# Patient Record
Sex: Female | Born: 1980 | Race: White | Hispanic: No | Marital: Married | Smoking: Current every day smoker
Health system: Southern US, Community
[De-identification: ages and names within clinical notes are randomized; demographics above are authoritative.]

## PROBLEM LIST (undated history)

## (undated) DIAGNOSIS — F419 Anxiety disorder, unspecified: Secondary | ICD-10-CM

## (undated) DIAGNOSIS — I10 Essential (primary) hypertension: Secondary | ICD-10-CM

## (undated) HISTORY — PX: ABDOMINAL HYSTERECTOMY: SHX81

---

## 1999-02-23 ENCOUNTER — Other Ambulatory Visit: Admission: RE | Admit: 1999-02-23 | Discharge: 1999-02-23 | Payer: Self-pay | Admitting: Gynecology

## 2000-04-24 ENCOUNTER — Other Ambulatory Visit: Admission: RE | Admit: 2000-04-24 | Discharge: 2000-04-24 | Payer: Self-pay | Admitting: Gynecology

## 2001-05-19 ENCOUNTER — Other Ambulatory Visit: Admission: RE | Admit: 2001-05-19 | Discharge: 2001-05-19 | Payer: Self-pay | Admitting: Gynecology

## 2002-06-04 ENCOUNTER — Other Ambulatory Visit: Admission: RE | Admit: 2002-06-04 | Discharge: 2002-06-04 | Payer: Self-pay | Admitting: Gynecology

## 2003-05-27 ENCOUNTER — Other Ambulatory Visit: Admission: RE | Admit: 2003-05-27 | Discharge: 2003-05-27 | Payer: Self-pay | Admitting: Gynecology

## 2007-10-13 ENCOUNTER — Ambulatory Visit: Payer: Self-pay

## 2008-05-09 ENCOUNTER — Ambulatory Visit: Payer: Self-pay | Admitting: Family Medicine

## 2008-11-21 IMAGING — CR DG CHEST 1V
1 series · 1 of 1 positions shown · non-contrast
Comparison: none

REASON FOR EXAM: +ppd 3 5 yrs ago had  +skin DR Eulogia Gandarilla.
health   fax report  [phone]
COMMENTS:

[view not recorded]
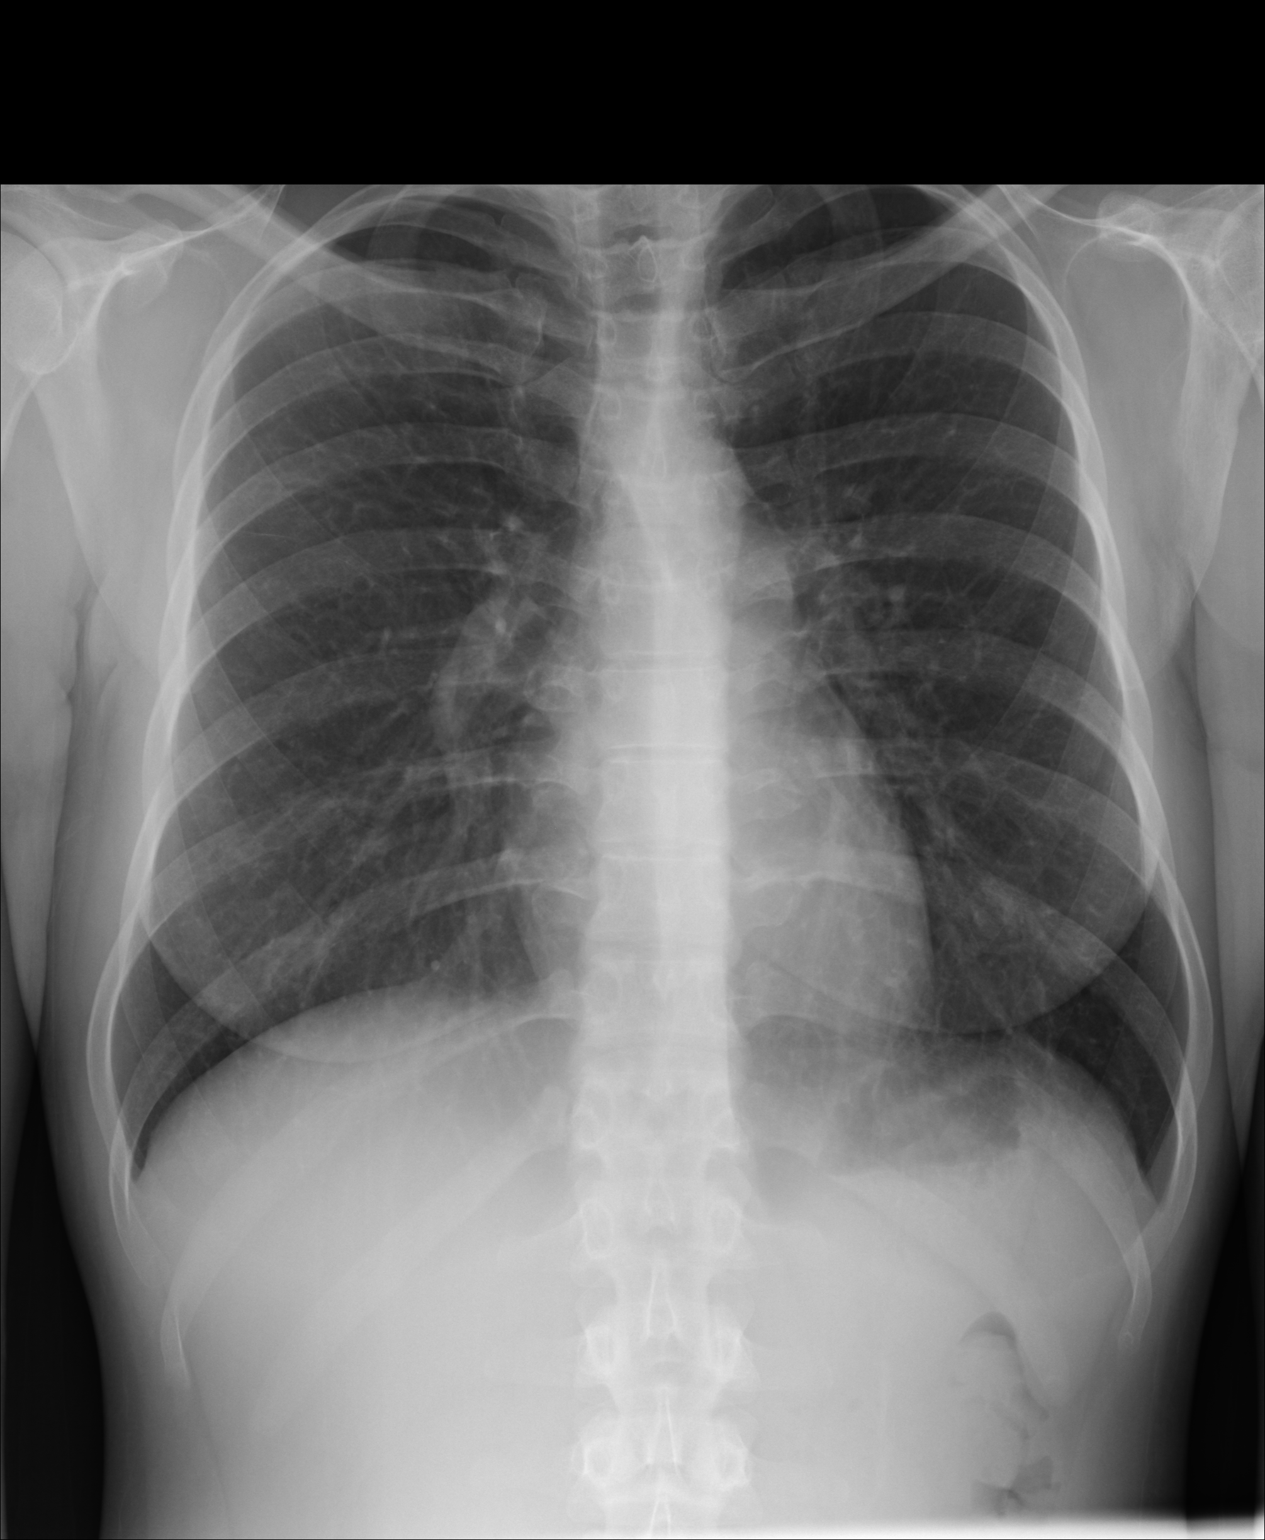

[1 of 1 positions shown; findings below may reference images not displayed]

PROCEDURE:     DXR - DXR CHEST 1 VIEWAP OR PA  - October 13, 2007 [DATE]

RESULT:     The lung fields are clear. Attention to the lung apices shows no
infiltrate or cystic changes suspicious for tuberculosis. The heart and
pulmonary vasculature are normal in appearance. No significant osseous
abnormalities are seen.
IMPRESSION: 1.     No significant abnormalities are noted.

## 2014-11-02 ENCOUNTER — Ambulatory Visit: Payer: Self-pay | Admitting: Obstetrics and Gynecology

## 2022-07-02 ENCOUNTER — Other Ambulatory Visit: Payer: Self-pay | Admitting: Obstetrics and Gynecology

## 2022-08-06 ENCOUNTER — Other Ambulatory Visit: Payer: Self-pay

## 2022-08-14 NOTE — H&P (Signed)
Jeanne Riley is a 41 y.o. female here for [facility]  bilateral salpingectomy  .Pt is here for follow up for menorrhagia for the > 1 yr . + clots and heavy bleeding 5-7 days  Pt underwent a SIS 10 months ago that show 2 endometrial polyps . Pt c/o band like lower abd / pelvic pain  for >3 yrs . + h/o constipation and she feels like she doesn't empty all the way. No dyspareunia .  Decreased sexual desire - Testosterone wnl  G3 P3  SVD x3      Pt here for follow up for Menorrhagia  EMBX neg  Pap last year neg  Ferritin 16     Past Medical History:  has a past medical history of Anxiety, COVID-19 (08/2021), COVID-19 (11/2019), Endometrial polyp (07/2021), Menorrhagia (07/2021), MVP (mitral valve prolapse), Obesity (BMI 30-39.9), unspecified, and Positive TB test.  Past Surgical History:  has a past surgical history that includes Arthroscopy Shoulder (Bilateral) and Wisdom Teeth. Family History: family history includes Alcohol abuse in her father; Breast cancer in her maternal aunt; Cancer in her maternal aunt; Diabetes type II in her maternal grandfather, maternal grandmother, and paternal aunt; High blood pressure (Hypertension) in her maternal grandfather, maternal grandmother, and mother; No Known Problems in her son, son, and son. Social History:  reports that she has been smoking cigarettes. She has been smoking an average of 1 pack per day. She has never used smokeless tobacco. She reports current alcohol use. She reports that she does not use drugs. OB/GYN History:  OB History       Gravida  3   Para  3   Term  3   Preterm  0   AB  0   Living  3        SAB  0   IAB  0   Ectopic  0   Molar  0   Multiple  0   Live Births  3             Allergies: is allergic to vicodin [hydrocodone-acetaminophen]. Medications:   Current Outpatient Medications:    ACETAMINOPHEN (TYLENOL ORAL), Take 1,000 mg by mouth every 6 (six) hours as needed, Disp: , Rfl:    CYANOCOBALAMIN,  VITAMIN B-12, ORAL, Take 1 tablet by mouth once daily, Disp: , Rfl:    enzymes,digestive (DIGESTIVE ENZYMES ORAL), Take 1 tablet by mouth once daily, Disp: , Rfl:    ibuprofen (ADVIL,MOTRIN) 200 MG tablet, Take 200 mg by mouth every 6 (six) hours as needed for Pain., Disp: , Rfl:    multivitamin tablet, Take 1 tablet by mouth once daily, Disp: , Rfl:    escitalopram oxalate (LEXAPRO) 5 MG tablet, Take 1 tablet (5 mg total) by mouth once daily May increase to 10 mg daily after 2-4 weeks if needed. (Patient not taking: Reported on 03/15/2022), Disp: 90 tablet, Rfl: 1   predniSONE (DELTASONE) 10 MG tablet, Taper:  6 tablets today, 5 tablets tomorrow, 4 tablets next day, 3 tablets next day, 2 tablets next day, 1 tablet on last day (Patient not taking: Reported on 05/13/2022), Disp: 21 tablet, Rfl: 0   Review of Systems: General:                      No fatigue or weight loss, +decreased sexual drive  Eyes:  No vision changes Ears:                            No hearing difficulty Respiratory:                No cough or shortness of breath Pulmonary:                  No asthma or shortness of breath Cardiovascular:           No chest pain, palpitations, dyspnea on exertion Gastrointestinal:          No abdominal bloating, chronic diarrhea, constipations, masses, pain or hematochezia Genitourinary:             No hematuria, dysuria, abnormal vaginal discharge, pelvic pain, +Menometrorrhagia Lymphatic:                   No swollen lymph nodes Musculoskeletal:         No muscle weakness Neurologic:                  No extremity weakness, syncope, seizure disorder Psychiatric:                  No history of depression, delusions or suicidal/homicidal ideation      Exam:       Vitals:    11/2/231112  BP: (!) 160/91  Pulse: (!) 130      Body mass index is 30.83 kg/m.   WDWN white/  female in NAD   Lungs: CTA  CV : RRR without murmur   Breast: exam done in sitting and  lying position : No dimpling or retraction, no dominant mass, no spontaneous discharge, no axillary adenopathy Neck:  no thyromegaly Abdomen: soft , no mass, normal active bowel sounds,  non-tender, no rebound tenderness Pelvic: tanner stage 5 ,  External genitalia: vulva /labia no lesions Urethra: no prolapse Vagina: normal physiologic d/c, adequate room for TVH if need be  Cervix: no lesions, no cervical motion tenderness   Uterus: normal size shape and contour, non-tender Adnexa: no mass,  non-tender   Rectovaginal:  .Saline infusion sonohysterography: betadine prep to the cervix followed by placement of the HSG catheter into the endometrial canal . Sterile H2O is injected while performing a transvaginal u/s . Findings: Saline  [place] Endometrial polyps seen 1= 1030 x 0.723 x 1.54 cm 2= 0.513 x 0.534 x 0.577 cm 3= 0.507 x 0.451 x 0.507 cm   Fibroid uterus 1 ant to endometrium= 3.55 cm 2 rt ant= 1.90 cm      Endometrium=  11.85 mm   bil ovs wnl    Impression:    The primary encounter diagnosis was Menorrhagia with regular cycle. Diagnoses of Endometrial polyp and Pelvic pain in female were also pertinent to this visit.       Plan:  Spoke to her about options to tx her menorrhagia . FXD+C , Myosure , endometrial ablation  vs TVH and BS . She elects for the latter  Benefits and risks to surgery: The proposed benefit of the surgery has been discussed with the patient. The possible risks include, but are not limited to: organ injury to the bowel , bladder, ureters, and major blood vessels and nerves. There is a possibility of additional surgeries resulting from these injuries. There is also the risk of blood transfusion and the need to receive blood products during or after the procedure which  may rarely lead to HIV or Hepatitis C infection. There is a risk of developing a deep venous thrombosis or a pulmonary embolism . There is the possibility of wound infection and also anesthetic  complications, even the rare possibility of death. The patient understands these risks and wishes to proceed.            No follow-ups on file.   Caroline Sauger, MD

## 2022-08-15 ENCOUNTER — Other Ambulatory Visit: Payer: Self-pay

## 2022-08-16 ENCOUNTER — Other Ambulatory Visit: Payer: Self-pay

## 2022-08-16 ENCOUNTER — Encounter
Admission: RE | Admit: 2022-08-16 | Discharge: 2022-08-16 | Disposition: A | Discharge status: Home / Self Care | Payer: Medicaid Other | Source: Ambulatory Visit | Attending: Obstetrics and Gynecology | Admitting: Obstetrics and Gynecology

## 2022-08-16 HISTORY — DX: Anxiety disorder, unspecified: F41.9

## 2022-08-16 HISTORY — DX: Essential (primary) hypertension: I10

## 2022-08-16 NOTE — Patient Instructions (Addendum)
Your procedure is scheduled on:08/26/2022  Report to the Registration Desk on the 1st floor of the [facility]. To find out your arrival time, please call [phone] between 1PM - 3PM on: 08/23/2022  If your arrival time is 6:00 am, do not arrive prior to that time as the [facility] entrance doors do not open until 6:00 am.  REMEMBER: Instructions that are not followed completely may result in serious medical risk, up to and including death; or upon the discretion of your surgeon and anesthesiologist your surgery may need to be rescheduled.  Do not eat food after midnight the night before surgery.  No gum chewing, lozengers or hard candies.  You may however, drink CLEAR liquids up to 2 hours before you are scheduled to arrive for your surgery. Do not drink anything within 2 hours of your scheduled arrival time.  Clear liquids include: - water  - apple juice without pulp - gatorade (not RED colors) - black coffee or tea (Do NOT add milk or creamers to the coffee or tea) Do NOT drink anything that is not on this list. In addition, your doctor has ordered for you to drink the provided  Ensure Pre-Surgery Clear Carbohydrate Drink  Drinking this carbohydrate drink up to two hours before surgery helps to reduce insulin resistance and improve patient outcomes. Please complete drinking 2 hours prior to scheduled arrival time.   One week prior to surgery: Stop Anti-inflammatories (NSAIDS) such as Advil, Aleve, Ibuprofen, Motrin, Naproxen, Naprosyn and Aspirin based products such as Excedrin, Goodys Powder, BC Powder. Stop ANY OVER THE COUNTER supplements until after surgery. You may however, continue to take Tylenol if needed for pain up until the day of surgery.  No Alcohol for 24 hours before or after surgery including wine.  No Smoking including e-cigarettes for 24 hours prior to surgery.  No chewable tobacco products for at least 6 hours prior to surgery.  No nicotine patches on  the day of surgery.  Do not use any "recreational" drugs for at least a week prior to your surgery.  Please be advised that the combination of cocaine and anesthesia may have negative outcomes, up to and including death. If you test positive for cocaine, your surgery will be cancelled.  On the morning of surgery brush your teeth with toothpaste and water, you may rinse your mouth with mouthwash if you wish. Do not swallow any toothpaste or mouthwash.  You may shower as directed on instruction sheet.  Do not wear jewelry, make-up, hairpins, clips or nail polish.  Do not wear lotions, powders, or perfumes.   Do not shave body from the neck down 48 hours prior to surgery just in case you cut yourself which could leave a site for infection.  Also, freshly shaved skin may become irritated if using the CHG soap.  Contact lenses, hearing aids and dentures may not be worn into surgery.   Do not bring valuables to the hospital. [facility] is not responsible for any missing/lost belongings or valuables.   Notify your doctor if there is any change in your medical condition (cold, fever, infection).  Wear comfortable clothing (specific to your surgery type) to the hospital.  After surgery, you can help prevent lung complications by doing breathing exercises.  Take deep breaths and cough every 1-2 hours. Your doctor may order a device called an Incentive Spirometer to help you take deep breaths. If you are being admitted to the hospital overnight, leave your suitcase in the  car. After surgery it may be brought to your room.  If you are being discharged the day of surgery, you will not be allowed to drive home. You will need a responsible adult (18 years or older) to drive you home and stay with you that night.   If you are taking public transportation, you will need to have a responsible adult (18 years or older) with you. Please confirm with your physician that it is acceptable to use public  transportation.   Please call the [place] [facility] at [phone] if you have any questions about these instructions.  Surgery Visitation Policy:  Patients undergoing a surgery or procedure may have two family members or support persons with them as long as the person is not COVID-19 positive or experiencing its symptoms.   Inpatient Visitation:    Visiting hours are 7 a.m. to 8 p.m. Up to four visitors are allowed at one time in a patient room, including children. The visitors may rotate out with other people during the day. One designated support person (adult) may remain overnight.

## 2022-08-19 ENCOUNTER — Encounter
Admission: RE | Admit: 2022-08-19 | Discharge: 2022-08-19 | Disposition: A | Discharge status: Home / Self Care | Payer: Medicaid Other | Source: Ambulatory Visit | Attending: Obstetrics and Gynecology | Admitting: Obstetrics and Gynecology

## 2022-08-19 ENCOUNTER — Encounter: Payer: Self-pay | Admitting: Urgent Care

## 2022-08-19 DIAGNOSIS — Z01812 Encounter for preprocedural laboratory examination: Secondary | ICD-10-CM | POA: Diagnosis present

## 2022-08-19 DIAGNOSIS — Z01818 Encounter for other preprocedural examination: Secondary | ICD-10-CM

## 2022-08-19 LAB — BASIC METABOLIC PANEL
Anion gap: 7 (ref 5–15)
BUN: 21 mg/dL — ABNORMAL HIGH (ref 6–20)
CO2: 25 mmol/L (ref 22–32)
Calcium: 9.2 mg/dL (ref 8.9–10.3)
Chloride: 109 mmol/L (ref 98–111)
Creatinine, Ser: 0.87 mg/dL (ref 0.44–1.00)
GFR, Estimated: >60 mL/min (ref >60)
Glucose, Bld: 104 mg/dL — ABNORMAL HIGH (ref 70–99)
Potassium: 4.1 mmol/L (ref 3.5–5.1)
Sodium: 141 mmol/L (ref 135–145)

## 2022-08-19 LAB — CBC
HCT: 43.2 % (ref 36.0–46.0)
Hemoglobin: 14.4 g/dL (ref 12.0–15.0)
MCH: 29.4 pg (ref 26.0–34.0)
MCHC: 33.3 g/dL (ref 30.0–36.0)
MCV: 88.2 fL (ref 80.0–100.0)
Platelets: 282 10*3/uL (ref 150–400)
RBC: 4.9 MIL/uL (ref 3.87–5.11)
RDW: 14.6 % (ref 11.5–15.5)
WBC: 9 10*3/uL (ref 4.0–10.5)
nRBC: 0 % (ref 0.0–0.2)

## 2022-08-19 LAB — TYPE AND SCREEN
ABO/RH(D): A NEG
Antibody Screen: NEGATIVE

## 2022-08-22 ENCOUNTER — Other Ambulatory Visit: Payer: Self-pay

## 2022-08-25 MED ORDER — LACTATED RINGERS IV SOLN: INTRAVENOUS | Status: DC

## 2022-08-25 MED ORDER — ACETAMINOPHEN 500 MG PO TABS: 1000.0000 mg | ORAL_TABLET | ORAL | Status: AC

## 2022-08-25 MED ORDER — ORAL CARE MOUTH RINSE: 15.0000 mL | Freq: Once | OROMUCOSAL | Status: AC

## 2022-08-25 MED ORDER — CEFAZOLIN SODIUM-DEXTROSE 2-4 GM/100ML-% IV SOLN
2.0000 g | Freq: Once | INTRAVENOUS | Status: AC
  Administered 2022-08-26: 2 g via INTRAVENOUS

## 2022-08-25 MED ORDER — FAMOTIDINE 20 MG PO TABS: 20.0000 mg | ORAL_TABLET | Freq: Once | ORAL | Status: AC

## 2022-08-25 MED ORDER — GABAPENTIN 300 MG PO CAPS: 300.0000 mg | ORAL_CAPSULE | ORAL | Status: AC

## 2022-08-25 MED ORDER — POVIDONE-IODINE 10 % EX SWAB: 2.0000 | Freq: Once | CUTANEOUS | Status: DC

## 2022-08-25 MED ORDER — CHLORHEXIDINE GLUCONATE 0.12 % MT SOLN: 15.0000 mL | Freq: Once | OROMUCOSAL | Status: AC

## 2022-08-26 ENCOUNTER — Ambulatory Visit: Payer: Medicaid Other | Admitting: Anesthesiology

## 2022-08-26 ENCOUNTER — Encounter: Payer: Self-pay | Admitting: Obstetrics and Gynecology

## 2022-08-26 ENCOUNTER — Other Ambulatory Visit: Payer: Self-pay

## 2022-08-26 ENCOUNTER — Encounter
Admission: RE | Disposition: A | Discharge status: Home / Self Care | Payer: Self-pay | Source: Home / Self Care | Attending: Obstetrics and Gynecology

## 2022-08-26 ENCOUNTER — Ambulatory Visit
Admission: RE | Admit: 2022-08-26 | Discharge: 2022-08-26 | Disposition: A | Discharge status: Home / Self Care | Payer: Medicaid Other | Attending: Obstetrics and Gynecology | Admitting: Obstetrics and Gynecology

## 2022-08-26 DIAGNOSIS — N920 Excessive and frequent menstruation with regular cycle: Secondary | ICD-10-CM | POA: Insufficient documentation

## 2022-08-26 DIAGNOSIS — F419 Anxiety disorder, unspecified: Secondary | ICD-10-CM | POA: Insufficient documentation

## 2022-08-26 DIAGNOSIS — I341 Nonrheumatic mitral (valve) prolapse: Secondary | ICD-10-CM | POA: Insufficient documentation

## 2022-08-26 DIAGNOSIS — N840 Polyp of corpus uteri: Secondary | ICD-10-CM | POA: Insufficient documentation

## 2022-08-26 DIAGNOSIS — Z01818 Encounter for other preprocedural examination: Secondary | ICD-10-CM

## 2022-08-26 DIAGNOSIS — D259 Leiomyoma of uterus, unspecified: Secondary | ICD-10-CM | POA: Insufficient documentation

## 2022-08-26 DIAGNOSIS — F17210 Nicotine dependence, cigarettes, uncomplicated: Secondary | ICD-10-CM | POA: Insufficient documentation

## 2022-08-26 DIAGNOSIS — I10 Essential (primary) hypertension: Secondary | ICD-10-CM | POA: Insufficient documentation

## 2022-08-26 HISTORY — PX: VAGINAL HYSTERECTOMY: SHX2639

## 2022-08-26 LAB — POCT PREGNANCY, URINE: Preg Test, Ur: NEGATIVE

## 2022-08-26 LAB — ABO/RH: ABO/RH(D): A NEG

## 2022-08-26 SURGERY — HYSTERECTOMY, VAGINAL
Anesthesia: General | Site: Vagina | Laterality: Bilateral

## 2022-08-26 MED ORDER — 0.9 % SODIUM CHLORIDE (POUR BTL) OPTIME
TOPICAL | Status: DC | PRN
  Administered 2022-08-26: 500 mL

## 2022-08-26 MED ORDER — ACETAMINOPHEN 500 MG PO TABS
ORAL_TABLET | ORAL | Status: AC
  Administered 2022-08-26: 1000 mg via ORAL
  Filled 2022-08-26: qty 2

## 2022-08-26 MED ORDER — PROPOFOL 10 MG/ML IV BOLUS
INTRAVENOUS | Status: AC
  Filled 2022-08-26: qty 20

## 2022-08-26 MED ORDER — GABAPENTIN 300 MG PO CAPS
ORAL_CAPSULE | ORAL | Status: AC
  Administered 2022-08-26: 300 mg via ORAL
  Filled 2022-08-26: qty 1

## 2022-08-26 MED ORDER — LIDOCAINE-EPINEPHRINE 1 %-1:100000 IJ SOLN
INTRAMUSCULAR | Status: AC
  Filled 2022-08-26: qty 1

## 2022-08-26 MED ORDER — MIDAZOLAM HCL 2 MG/2ML IJ SOLN
INTRAMUSCULAR | Status: DC | PRN
  Administered 2022-08-26: 2 mg via INTRAVENOUS

## 2022-08-26 MED ORDER — HYDROMORPHONE HCL 1 MG/ML IJ SOLN
INTRAMUSCULAR | Status: AC
  Administered 2022-08-26: 0.25 mg via INTRAVENOUS
  Filled 2022-08-26: qty 1

## 2022-08-26 MED ORDER — CHLORHEXIDINE GLUCONATE 0.12 % MT SOLN
OROMUCOSAL | Status: AC
  Administered 2022-08-26: 15 mL via OROMUCOSAL
  Filled 2022-08-26: qty 15

## 2022-08-26 MED ORDER — DEXAMETHASONE SODIUM PHOSPHATE 10 MG/ML IJ SOLN
INTRAMUSCULAR | Status: DC | PRN
  Administered 2022-08-26: 10 mg via INTRAVENOUS

## 2022-08-26 MED ORDER — FENTANYL CITRATE (PF) 100 MCG/2ML IJ SOLN
INTRAMUSCULAR | Status: DC | PRN
  Administered 2022-08-26: 25 ug via INTRAVENOUS
  Administered 2022-08-26 (×2): 50 ug via INTRAVENOUS
  Administered 2022-08-26 (×3): 25 ug via INTRAVENOUS

## 2022-08-26 MED ORDER — HYDROMORPHONE HCL 1 MG/ML IJ SOLN
0.2500 mg | INTRAMUSCULAR | Status: DC | PRN
  Administered 2022-08-26 (×2): 0.25 mg via INTRAVENOUS
  Administered 2022-08-26: 0.5 mg via INTRAVENOUS
  Administered 2022-08-26: 0.25 mg via INTRAVENOUS

## 2022-08-26 MED ORDER — ONDANSETRON HCL 4 MG/2ML IJ SOLN
INTRAMUSCULAR | Status: DC | PRN
  Administered 2022-08-26: 4 mg via INTRAVENOUS

## 2022-08-26 MED ORDER — LIDOCAINE HCL (CARDIAC) PF 100 MG/5ML IV SOSY
PREFILLED_SYRINGE | INTRAVENOUS | Status: DC | PRN
  Administered 2022-08-26: 100 mg via INTRAVENOUS

## 2022-08-26 MED ORDER — OXYCODONE HCL 5 MG PO TABS: 5.0000 mg | ORAL_TABLET | Freq: Once | ORAL | Status: AC | PRN

## 2022-08-26 MED ORDER — FENTANYL CITRATE (PF) 100 MCG/2ML IJ SOLN
INTRAMUSCULAR | Status: AC
  Filled 2022-08-26: qty 2

## 2022-08-26 MED ORDER — ONDANSETRON 4 MG PO TBDP: 4.0000 mg | ORAL_TABLET | Freq: Four times a day (QID) | ORAL | Status: DC | PRN

## 2022-08-26 MED ORDER — ONDANSETRON HCL 4 MG/2ML IJ SOLN
INTRAMUSCULAR | Status: AC
  Filled 2022-08-26: qty 2

## 2022-08-26 MED ORDER — OXYCODONE HCL 5 MG/5ML PO SOLN: 5.0000 mg | Freq: Once | ORAL | Status: AC | PRN

## 2022-08-26 MED ORDER — GLYCOPYRROLATE 0.2 MG/ML IJ SOLN
INTRAMUSCULAR | Status: DC | PRN
  Administered 2022-08-26: .2 mg via INTRAVENOUS

## 2022-08-26 MED ORDER — HYDROMORPHONE HCL 1 MG/ML IJ SOLN
INTRAMUSCULAR | Status: AC
  Administered 2022-08-26: 0.5 mg via INTRAVENOUS
  Filled 2022-08-26: qty 1

## 2022-08-26 MED ORDER — DEXAMETHASONE SODIUM PHOSPHATE 10 MG/ML IJ SOLN
INTRAMUSCULAR | Status: AC
  Filled 2022-08-26: qty 1

## 2022-08-26 MED ORDER — OXYCODONE HCL 5 MG PO TABS: 5.0000 mg | ORAL_TABLET | ORAL | Status: DC | PRN

## 2022-08-26 MED ORDER — GLYCOPYRROLATE 0.2 MG/ML IJ SOLN
INTRAMUSCULAR | Status: AC
  Filled 2022-08-26: qty 2

## 2022-08-26 MED ORDER — FAMOTIDINE 20 MG PO TABS
ORAL_TABLET | ORAL | Status: AC
  Administered 2022-08-26: 20 mg via ORAL
  Filled 2022-08-26: qty 1

## 2022-08-26 MED ORDER — KETOROLAC TROMETHAMINE 30 MG/ML IJ SOLN
INTRAMUSCULAR | Status: AC
  Filled 2022-08-26: qty 1

## 2022-08-26 MED ORDER — MIDAZOLAM HCL 2 MG/2ML IJ SOLN
INTRAMUSCULAR | Status: AC
  Filled 2022-08-26: qty 2

## 2022-08-26 MED ORDER — OXYCODONE HCL 5 MG PO TABS
ORAL_TABLET | ORAL | Status: AC
  Administered 2022-08-26: 5 mg via ORAL
  Filled 2022-08-26: qty 1

## 2022-08-26 MED ORDER — DEXMEDETOMIDINE HCL IN NACL 200 MCG/50ML IV SOLN
INTRAVENOUS | Status: DC | PRN
  Administered 2022-08-26: 4 ug via INTRAVENOUS
  Administered 2022-08-26 (×2): 8 ug via INTRAVENOUS

## 2022-08-26 MED ORDER — STERILE WATER FOR IRRIGATION IR SOLN
Status: DC | PRN
  Administered 2022-08-26: 500 mL

## 2022-08-26 MED ORDER — LIDOCAINE-EPINEPHRINE 1 %-1:100000 IJ SOLN
INTRAMUSCULAR | Status: DC | PRN
  Administered 2022-08-26: 16 mL

## 2022-08-26 MED ORDER — CEFAZOLIN SODIUM-DEXTROSE 2-4 GM/100ML-% IV SOLN
INTRAVENOUS | Status: AC
  Filled 2022-08-26: qty 100

## 2022-08-26 MED ORDER — PROPOFOL 10 MG/ML IV BOLUS
INTRAVENOUS | Status: DC | PRN
  Administered 2022-08-26: 50 mg via INTRAVENOUS
  Administered 2022-08-26: 150 mg via INTRAVENOUS

## 2022-08-26 MED ORDER — KETOROLAC TROMETHAMINE 30 MG/ML IJ SOLN
INTRAMUSCULAR | Status: DC | PRN
  Administered 2022-08-26: 30 mg via INTRAVENOUS

## 2022-08-26 MED ORDER — LIDOCAINE HCL (PF) 2 % IJ SOLN
INTRAMUSCULAR | Status: AC
  Filled 2022-08-26: qty 5

## 2022-08-26 SURGICAL SUPPLY — 43 items
BAG DRN RND TRDRP ANRFLXCHMBR (UROLOGICAL SUPPLIES)
BAG URINE DRAIN 2000ML AR STRL (UROLOGICAL SUPPLIES) IMPLANT
CATH FOLEY 2WAY  5CC 16FR (CATHETERS) ×1
CATH FOLEY 2WAY 5CC 16FR (CATHETERS) ×1
CATH ROBINSON RED A/P 16FR (CATHETERS) ×1 IMPLANT
CATH URTH 16FR FL 2W BLN LF (CATHETERS) IMPLANT
DRAPE PERI LITHO V/GYN (MISCELLANEOUS) ×1 IMPLANT
DRAPE SURG 17X11 SM STRL (DRAPES) ×1 IMPLANT
DRAPE UNDER BUTTOCK W/FLU (DRAPES) ×1 IMPLANT
ELECT REM PT RETURN 9FT ADLT (ELECTROSURGICAL) ×1
ELECTRODE REM PT RTRN 9FT ADLT (ELECTROSURGICAL) ×1 IMPLANT
GAUZE 4X4 16PLY ~~LOC~~+RFID DBL (SPONGE) ×2 IMPLANT
GLOVE SURG PROTEXIS BL SZ6.5 (GLOVE) ×2 IMPLANT
GLOVE SURG SYN 6.5 PF PI BL (GLOVE) IMPLANT
GLOVE SURG SYN 8.0 (GLOVE) ×1 IMPLANT
GLOVE SURG SYN 8.0 PF PI (GLOVE) ×1 IMPLANT
GOWN STRL REUS W/ TWL LRG LVL3 (GOWN DISPOSABLE) ×3 IMPLANT
GOWN STRL REUS W/ TWL XL LVL3 (GOWN DISPOSABLE) ×1 IMPLANT
GOWN STRL REUS W/TWL LRG LVL3 (GOWN DISPOSABLE) ×3
GOWN STRL REUS W/TWL XL LVL3 (GOWN DISPOSABLE) ×1
IV SODIUM CHL 0.9% 500ML (IV SOLUTION) IMPLANT
KIT TURNOVER CYSTO (KITS) ×1 IMPLANT
LABEL OR SOLS (LABEL) ×1 IMPLANT
MANIFOLD NEPTUNE II (INSTRUMENTS) ×1 IMPLANT
NEEDLE HYPO 22GX1.5 SAFETY (NEEDLE) ×1 IMPLANT
PACK BASIN MINOR ARMC (MISCELLANEOUS) ×1 IMPLANT
PAD OB MATERNITY 4.3X12.25 (PERSONAL CARE ITEMS) ×1 IMPLANT
PAD PREP 24X41 OB/GYN DISP (PERSONAL CARE ITEMS) ×1 IMPLANT
SCRUB CHG 4% DYNA-HEX 4OZ (MISCELLANEOUS) ×1 IMPLANT
SOL SCRUB PVP POV-IOD 4OZ 7.5% (MISCELLANEOUS) ×1
SOLUTION SCRB POV-IOD 4OZ 7.5% (MISCELLANEOUS) ×1 IMPLANT
SURGILUBE 2OZ TUBE FLIPTOP (MISCELLANEOUS) ×1 IMPLANT
SUT PDS 2-0 27IN (SUTURE) IMPLANT
SUT VIC AB 0 CT1 27 (SUTURE) ×4
SUT VIC AB 0 CT1 27XCR 8 STRN (SUTURE) ×2 IMPLANT
SUT VIC AB 0 CT1 36 (SUTURE) ×1 IMPLANT
SUT VIC AB 2-0 SH 27 (SUTURE) ×1
SUT VIC AB 2-0 SH 27XBRD (SUTURE) ×1 IMPLANT
SYR 10ML LL (SYRINGE) ×1 IMPLANT
SYR CONTROL 10ML LL (SYRINGE) ×1 IMPLANT
TRAP FLUID SMOKE EVACUATOR (MISCELLANEOUS) ×1 IMPLANT
WATER STERILE IRR 1000ML POUR (IV SOLUTION) ×1 IMPLANT
WATER STERILE IRR 500ML POUR (IV SOLUTION) ×1 IMPLANT

## 2022-08-26 NOTE — Anesthesia Preprocedure Evaluation (Signed)
Anesthesia Evaluation  Patient identified by MRN, date of birth, ID band Patient awake    Reviewed: Allergy & Precautions, NPO status , Patient's Chart, lab work & pertinent test results  History of Anesthesia Complications Negative for: history of anesthetic complications  Airway Mallampati: II  TM Distance: >3 FB Neck ROM: full    Dental  (+) Chipped, Teeth Intact   Pulmonary neg pulmonary ROS, Current Smoker and Patient abstained from smoking.   Pulmonary exam normal        Cardiovascular Exercise Tolerance: Good hypertension, On Medications Normal cardiovascular exam     Neuro/Psych  PSYCHIATRIC DISORDERS Anxiety     negative neurological ROS     GI/Hepatic negative GI ROS, Neg liver ROS,,,  Endo/Other  negative endocrine ROS    Renal/GU      Musculoskeletal   Abdominal   Peds  Hematology negative hematology ROS (+)   Anesthesia Other Findings Past Medical History: No date: Anxiety No date: Hypertension  History reviewed. No pertinent surgical history.  BMI    Body Mass Index: 31.15 kg/m      Reproductive/Obstetrics negative OB ROS                             Anesthesia Physical Anesthesia Plan  ASA: 2  Anesthesia Plan: General LMA   Post-op Pain Management: Dilaudid IV, Toradol IV (intra-op)*, Tylenol PO (pre-op)* and Gabapentin PO (pre-op)*   Induction: Intravenous  PONV Risk Score and Plan: 3 and Dexamethasone, Ondansetron, Midazolam and Treatment may vary due to age or medical condition  Airway Management Planned: LMA  Additional Equipment:   Intra-op Plan:   Post-operative Plan: Extubation in OR  Informed Consent: I have reviewed the patients History and Physical, chart, labs and discussed the procedure including the risks, benefits and alternatives for the proposed anesthesia with the patient or authorized representative who has indicated his/her  understanding and acceptance.     Dental Advisory Given  Plan Discussed with: Anesthesiologist, CRNA and Surgeon  Anesthesia Plan Comments: (Patient consented for risks of anesthesia including but not limited to:  - adverse reactions to medications - damage to eyes, teeth, lips or other oral mucosa - nerve damage due to positioning  - sore throat or hoarseness - Damage to heart, brain, nerves, lungs, other parts of body or loss of life  Patient voiced understanding.)       Anesthesia Quick Evaluation

## 2022-08-26 NOTE — Transfer of Care (Signed)
Immediate Anesthesia Transfer of Care Note  Patient: Jeanne Riley  Procedure(s) Performed: HYSTERECTOMY VAGINAL, BILATERAL SALPINGECTOMY (Bilateral: Vagina )  Patient Location: PACU  Anesthesia Type:General  Level of Consciousness: drowsy and patient cooperative  Airway & Oxygen Therapy: Patient Spontanous Breathing and Patient connected to face mask oxygen  Post-op Assessment: Report given to RN and Post -op Vital signs reviewed and stable  Post vital signs: Reviewed and stable  Last Vitals:  Vitals Value Taken Time  BP 126/86 08/26/22 0915  Temp 36.6 C 08/26/22 0915  Pulse 95 08/26/22 0925  Resp 12 08/26/22 0925  SpO2 93 % 08/26/22 0925  Vitals shown include unvalidated device data.  Last Pain:  Vitals:   08/26/22 0915  TempSrc:   PainSc: 8          Complications: No notable events documented.

## 2022-08-26 NOTE — Discharge Instructions (Signed)

## 2022-08-26 NOTE — Op Note (Signed)
NAME: Jeanne Riley, Jeanne Riley MEDICAL RECORD NO: [id number] ACCOUNT NO: [id number] DATE OF BIRTH: Jul 13, 1981 FACILITY: [facility] LOCATION: [facility] PHYSICIAN: Boykin Nearing, MD  Operative Report   DATE OF PROCEDURE: 08/26/2022  PREOPERATIVE DIAGNOSES:  1.  Menorrhagia. 2.  Endometrial polyps.  POSTOPERATIVE DIAGNOSES:  1.  Menorrhagia. 2.  Endometrial polyps.  PROCEDURE:  1.  Total vaginal hysterectomy. 2.  Bilateral salpingectomy.  ANESTHESIA:  General endotracheal anesthesia.  SURGEON:  Boykin Nearing, MD  FIRST ASSISTANT:  Donneta Romberg, MD  SECOND ASSISTANT:  PA student, Corwin Sink.  INDICATIONS:  A 41 year old female with long history of menorrhagia with a saline infusion sonohysterography that demonstrated 2 separate polyps.  The patient has elected for permanent definitive surgery.  DESCRIPTION OF PROCEDURE:  After adequate general endotracheal anesthesia, the patient was placed in dorsal supine position with legs placed in the candy cane stirrups.  The patient's abdomen, perineum and vagina were prepped and draped in normal sterile  fashion.  The patient did receive 2 grams of IV Ancef for surgical prophylaxis.  Timeout was performed.  Straight catheterization of the bladder yielded 30 mL clear urine.  Weighted speculum was placed in the posterior vaginal vault and the cervix was  grasped with 2 thyroid tenacula.  Cervix was circumferentially injected with 1% lidocaine with 1:100,000 epinephrine.  A direct posterior colpotomy incision was made. Upon entry into the posterior cul-de-sac, the peritoneum was tagged to the cervical epithelium.  The uterosacral ligaments were then bilaterally clamped, transected and suture ligated with 0 Vicryl suture.  Cardinal ligaments were then clamped and transection and suture ligated .  These were tagged for later  identification.  The anterior cervix was circumferentially incised with the Bovie followed by bilateral clamping of  the cardinal ligaments, transection and suture ligating.  The anterior cul-de-sac was entered sharply without difficulty and Deaver  retractor was placed within to elevate the bladder anteriorly.  Uterine arteries were then bilaterally clamped, transected and suture ligated 0 Vicryl suture.  Sequential clamping of the broad ligament ensued.  Uterus was then decompressed with the Bovie  given the girth of the uterus.  The cornua were then bilaterally clamped, transected and suture ligated doubly with 0 Vicryl suture.  Uterus and cervix were delivered.  Good hemostasis was noted.  Each fallopian tube was grasped with the distal portion  and each distal portion of fallopian tube was clamped, transected and suture ligated with 0 Vicryl suture.  Good hemostasis was noted.  The peritoneum was then closed with a 2-0 PDS suture in a pursestring fashion and the vaginal cuff was then closed  vertically with a running 0 Vicryl suture.  Good hemostasis noted.  Uterosacral ligaments were plicated centrally and the rest of the vaginal cuff was closed with the 0 Vicryl suture.  Straight catheterization of the bladder yielded 20 mL additional  urine at the end of the case.  There were no complications.  ESTIMATED BLOOD LOSS:  30 mL  INTRAOPERATIVE FLUIDS:  800 mL  URINE OUTPUT:  50 mL  The patient did receive 30 mg intravenous Toradol at the end of the procedure.      PAA D: 08/26/2022 9:15:55 am T: 08/26/2022 10:33:00 am  JOB: [id number]/ [id number]

## 2022-08-26 NOTE — Progress Notes (Signed)
Pt here for TVH and BS . NPO except ensure drink . Labs reviewed . Neg HCG . Proceed

## 2022-08-26 NOTE — Anesthesia Postprocedure Evaluation (Signed)
Anesthesia Post Note  Patient: Jeanne Riley  Procedure(s) Performed: HYSTERECTOMY VAGINAL, BILATERAL SALPINGECTOMY (Bilateral: Vagina )  Patient location during evaluation: PACU Anesthesia Type: General Level of consciousness: awake and alert Pain management: pain level controlled Vital Signs Assessment: post-procedure vital signs reviewed and stable Respiratory status: spontaneous breathing, nonlabored ventilation, respiratory function stable and patient connected to nasal cannula oxygen Cardiovascular status: blood pressure returned to baseline and stable Postop Assessment: no apparent nausea or vomiting Anesthetic complications: no   No notable events documented.   Last Vitals:  Vitals:   08/26/22 0945 08/26/22 0950  BP: (!) 127/97   Pulse: 94 94  Resp: 10 12  Temp:    SpO2: 95% 91%    Last Pain:  Vitals:   08/26/22 0950  TempSrc:   PainSc: 4                  Ilene Qua

## 2022-08-26 NOTE — Brief Op Note (Signed)
08/26/2022  9:04 AM  PATIENT:  Jeanne Riley  41 y.o. female  PRE-OPERATIVE DIAGNOSIS:  menorrhagia, endometrial polyps  POST-OPERATIVE DIAGNOSIS:  menorrhagia, endometrial polyps  PROCEDURE:  Procedure(s): HYSTERECTOMY VAGINAL, BILATERAL SALPINGECTOMY (Bilateral)  SURGEON:  Surgeon(s) and Role:    * Shatona Andujar, Gwen Her, MD - Primary    * Will Bonnet, MD - Assisting  PHYSICIAN ASSISTANT: PA student Clara [place] Sink  ASSISTANTS: none   ANESTHESIA:   general  EBL:  5 mL   BLOOD ADMINISTERED:none  DRAINS: none   LOCAL MEDICATIONS USED:  LIDOCAINE   SPECIMEN:  Source of Specimen:  cervix , uterus and bilateral tubes  DISPOSITION OF SPECIMEN:  PATHOLOGY  COUNTS:  YES  TOURNIQUET:  * No tourniquets in log *  DICTATION: .Other Dictation: Dictation Number verbal  PLAN OF CARE: Discharge to home after PACU  PATIENT DISPOSITION:  PACU - hemodynamically stable.   Delay start of Pharmacological VTE agent (>24hrs) due to surgical blood loss or risk of bleeding: not applicable

## 2022-08-26 NOTE — Anesthesia Procedure Notes (Signed)
Procedure Name: LMA Insertion Date/Time: 08/26/2022 7:41 AM  Performed by: Jonna Clark, CRNAPre-anesthesia Checklist: Patient identified, Patient being monitored, Timeout performed, Emergency Drugs available and Suction available Patient Re-evaluated:Patient Re-evaluated prior to induction Oxygen Delivery Method: Circle system utilized Preoxygenation: Pre-oxygenation with 100% oxygen Induction Type: IV induction Ventilation: Mask ventilation without difficulty LMA: LMA inserted LMA Size: 4.0 Tube type: Oral Number of attempts: 1 Placement Confirmation: positive ETCO2 and breath sounds checked- equal and bilateral Tube secured with: Tape Dental Injury: Teeth and Oropharynx as per pre-operative assessment

## 2022-08-27 ENCOUNTER — Encounter: Payer: Self-pay | Admitting: Obstetrics and Gynecology

## 2022-08-27 LAB — SURGICAL PATHOLOGY

## 2022-11-12 ENCOUNTER — Other Ambulatory Visit: Payer: Self-pay | Admitting: Physician Assistant

## 2022-11-12 DIAGNOSIS — R200 Anesthesia of skin: Secondary | ICD-10-CM

## 2022-11-12 DIAGNOSIS — H538 Other visual disturbances: Secondary | ICD-10-CM

## 2022-11-12 DIAGNOSIS — R4189 Other symptoms and signs involving cognitive functions and awareness: Secondary | ICD-10-CM

## 2022-11-12 DIAGNOSIS — R29898 Other symptoms and signs involving the musculoskeletal system: Secondary | ICD-10-CM

## 2022-11-12 DIAGNOSIS — H539 Unspecified visual disturbance: Secondary | ICD-10-CM

## 2022-11-12 DIAGNOSIS — R251 Tremor, unspecified: Secondary | ICD-10-CM

## 2022-12-05 ENCOUNTER — Ambulatory Visit
Admission: RE | Admit: 2022-12-05 | Discharge: 2022-12-05 | Disposition: A | Discharge status: Home / Self Care | Payer: Medicaid Other | Source: Ambulatory Visit | Attending: Physician Assistant | Admitting: Physician Assistant

## 2022-12-05 DIAGNOSIS — R29898 Other symptoms and signs involving the musculoskeletal system: Secondary | ICD-10-CM | POA: Insufficient documentation

## 2022-12-05 DIAGNOSIS — H539 Unspecified visual disturbance: Secondary | ICD-10-CM | POA: Insufficient documentation

## 2022-12-05 DIAGNOSIS — R4189 Other symptoms and signs involving cognitive functions and awareness: Secondary | ICD-10-CM | POA: Insufficient documentation

## 2022-12-05 DIAGNOSIS — H538 Other visual disturbances: Secondary | ICD-10-CM | POA: Insufficient documentation

## 2022-12-05 DIAGNOSIS — R200 Anesthesia of skin: Secondary | ICD-10-CM | POA: Insufficient documentation

## 2022-12-05 DIAGNOSIS — R251 Tremor, unspecified: Secondary | ICD-10-CM | POA: Diagnosis present

## 2022-12-05 DIAGNOSIS — R202 Paresthesia of skin: Secondary | ICD-10-CM | POA: Insufficient documentation

## 2022-12-26 ENCOUNTER — Encounter: Payer: Self-pay | Admitting: *Deleted

## 2022-12-27 ENCOUNTER — Encounter
Admission: RE | Disposition: A | Discharge status: Home / Self Care | Payer: Self-pay | Source: Home / Self Care | Attending: Gastroenterology

## 2022-12-27 ENCOUNTER — Ambulatory Visit: Payer: Medicaid Other | Admitting: Anesthesiology

## 2022-12-27 ENCOUNTER — Ambulatory Visit
Admission: RE | Admit: 2022-12-27 | Discharge: 2022-12-27 | Disposition: A | Discharge status: Home / Self Care | Payer: Medicaid Other | Attending: Gastroenterology | Admitting: Gastroenterology

## 2022-12-27 ENCOUNTER — Encounter: Payer: Self-pay | Admitting: *Deleted

## 2022-12-27 DIAGNOSIS — I10 Essential (primary) hypertension: Secondary | ICD-10-CM | POA: Diagnosis not present

## 2022-12-27 DIAGNOSIS — Z6831 Body mass index (BMI) 31.0-31.9, adult: Secondary | ICD-10-CM | POA: Insufficient documentation

## 2022-12-27 DIAGNOSIS — Z09 Encounter for follow-up examination after completed treatment for conditions other than malignant neoplasm: Secondary | ICD-10-CM | POA: Insufficient documentation

## 2022-12-27 DIAGNOSIS — Z8616 Personal history of COVID-19: Secondary | ICD-10-CM | POA: Insufficient documentation

## 2022-12-27 DIAGNOSIS — R194 Change in bowel habit: Secondary | ICD-10-CM | POA: Insufficient documentation

## 2022-12-27 DIAGNOSIS — K640 First degree hemorrhoids: Secondary | ICD-10-CM | POA: Diagnosis not present

## 2022-12-27 DIAGNOSIS — Z90710 Acquired absence of both cervix and uterus: Secondary | ICD-10-CM | POA: Insufficient documentation

## 2022-12-27 DIAGNOSIS — E669 Obesity, unspecified: Secondary | ICD-10-CM | POA: Insufficient documentation

## 2022-12-27 HISTORY — PX: COLONOSCOPY WITH PROPOFOL: SHX5780

## 2022-12-27 SURGERY — COLONOSCOPY WITH PROPOFOL
Anesthesia: General

## 2022-12-27 MED ORDER — SODIUM CHLORIDE 0.9 % IV SOLN: INTRAVENOUS | Status: DC

## 2022-12-27 MED ORDER — DEXMEDETOMIDINE HCL IN NACL 200 MCG/50ML IV SOLN
INTRAVENOUS | Status: DC | PRN
  Administered 2022-12-27 (×2): 10 ug via INTRAVENOUS

## 2022-12-27 MED ORDER — LIDOCAINE HCL (CARDIAC) PF 100 MG/5ML IV SOSY
PREFILLED_SYRINGE | INTRAVENOUS | Status: DC | PRN
  Administered 2022-12-27: 50 mg via INTRAVENOUS

## 2022-12-27 MED ORDER — PROPOFOL 500 MG/50ML IV EMUL
INTRAVENOUS | Status: DC | PRN
  Administered 2022-12-27: 110 ug/kg/min via INTRAVENOUS

## 2022-12-27 MED ORDER — PROPOFOL 10 MG/ML IV BOLUS
INTRAVENOUS | Status: DC | PRN
  Administered 2022-12-27: 80 mg via INTRAVENOUS
  Administered 2022-12-27: 20 mg via INTRAVENOUS
  Administered 2022-12-27: 100 mg via INTRAVENOUS
  Administered 2022-12-27: 20 mg via INTRAVENOUS

## 2022-12-27 NOTE — Anesthesia Preprocedure Evaluation (Signed)
Anesthesia Evaluation  Patient identified by MRN, date of birth, ID band Patient awake    Reviewed: Allergy & Precautions, NPO status , Patient's Chart, lab work & pertinent test results  Airway Mallampati: II  TM Distance: >3 FB Neck ROM: full    Dental  (+) Chipped, Dental Advidsory Given   Pulmonary neg pulmonary ROS, neg shortness of breath, neg COPD, Current Smoker   Pulmonary exam normal        Cardiovascular hypertension, (-) angina (-) Past MI and (-) CABG negative cardio ROS Normal cardiovascular exam     Neuro/Psych  PSYCHIATRIC DISORDERS      negative neurological ROS     GI/Hepatic negative GI ROS, Neg liver ROS,,,  Endo/Other  negative endocrine ROS    Renal/GU negative Renal ROS  negative genitourinary   Musculoskeletal   Abdominal   Peds  Hematology negative hematology ROS (+)   Anesthesia Other Findings Past Medical History: No date: Anxiety No date: Hypertension  Past Surgical History: No date: ABDOMINAL HYSTERECTOMY 08/26/2022: VAGINAL HYSTERECTOMY; Bilateral     Comment:  Procedure: HYSTERECTOMY VAGINAL, BILATERAL               SALPINGECTOMY;  Surgeon: Schermerhorn, Gwen Her, MD;                Location: [facility] OR[facility];  Service: Gynecology;  Laterality:               Bilateral;  BMI    Body Mass Index: 31.47 kg/m      Reproductive/Obstetrics negative OB ROS                             Anesthesia Physical Anesthesia Plan  ASA: 2  Anesthesia Plan: General   Post-op Pain Management: Minimal or no pain anticipated   Induction: Intravenous  PONV Risk Score and Plan: 3 and Propofol infusion, TIVA and Ondansetron  Airway Management Planned: Nasal Cannula  Additional Equipment: None  Intra-op Plan:   Post-operative Plan:   Informed Consent: I have reviewed the patients History and Physical, chart, labs and discussed the procedure including the risks,  benefits and alternatives for the proposed anesthesia with the patient or authorized representative who has indicated his/her understanding and acceptance.     Dental advisory given  Plan Discussed with: CRNA and Surgeon  Anesthesia Plan Comments: (Discussed risks of anesthesia with patient, including possibility of difficulty with spontaneous ventilation under anesthesia necessitating airway intervention, PONV, and rare risks such as cardiac or respiratory or neurological events, and allergic reactions. Discussed the role of CRNA in patient's perioperative care. Patient understands.)       Anesthesia Quick Evaluation

## 2022-12-27 NOTE — H&P (Signed)
Outpatient short stay form Pre-procedure 12/27/2022  Lesly Rubenstein, MD  Primary Physician: Sallee Lange, NP  Reason for visit:  Change in bowel habits  History of present illness:    42 y/o lady with history of hypertension and obesity here for colonoscopy due to change in bowel habits since getting covid 2 years ago. No blood thinners. No known family history of GI problems. History of hysterectomy.    Current Facility-Administered Medications:    0.9 %  sodium chloride infusion, , Intravenous, Continuous, Kerryn Tennant, Hilton Cork, MD, Last Rate: 20 mL/hr at 12/27/22 1333, Continued from Pre-op at 12/27/22 1333  Medications Prior to Admission  Medication Sig Dispense Refill Last Dose   losartan (COZAAR) 25 MG tablet Take 25 mg by mouth daily.   12/26/2022   acetaminophen (TYLENOL) 500 MG tablet Take 1,000 mg by mouth daily as needed for moderate pain.      ibuprofen (ADVIL) 200 MG tablet Take 200 mg by mouth daily as needed for mild pain.        Allergies  Allergen Reactions   Hydrocodone-Acetaminophen Itching    "Vicodin"     Past Medical History:  Diagnosis Date   Anxiety    Hypertension     Review of systems:  Otherwise negative.    Physical Exam  Gen: Alert, oriented. Appears stated age.  HEENT: PERRLA. Lungs: No respiratory distress CV: RRR Abd: soft, benign, no masses Ext: No edema    Planned procedures: Proceed with colonoscopy. The patient understands the nature of the planned procedure, indications, risks, alternatives and potential complications including but not limited to bleeding, infection, perforation, damage to internal organs and possible oversedation/side effects from anesthesia. The patient agrees and gives consent to proceed.  Please refer to procedure notes for findings, recommendations and patient disposition/instructions.     Lesly Rubenstein, MD [facility]

## 2022-12-27 NOTE — Interval H&P Note (Signed)
History and Physical Interval Note:  12/27/2022 1:47 PM  Jeanne Riley  has presented today for surgery, with the diagnosis of bowel habit changes.  The various methods of treatment have been discussed with the patient and family. After consideration of risks, benefits and other options for treatment, the patient has consented to  Procedure(s): COLONOSCOPY WITH PROPOFOL (N/A) as a surgical intervention.  The patient's history has been reviewed, patient examined, no change in status, stable for surgery.  I have reviewed the patient's chart and labs.  Questions were answered to the patient's satisfaction.     Lesly Rubenstein  Ok to proceed with colonoscopy

## 2022-12-27 NOTE — Transfer of Care (Signed)
Immediate Anesthesia Transfer of Care Note  Patient: Jeanne Riley  Procedure(s) Performed: COLONOSCOPY WITH PROPOFOL  Patient Location: PACU  Anesthesia Type:MAC  Level of Consciousness: awake, alert , and oriented  Airway & Oxygen Therapy: Patient Spontanous Breathing  Post-op Assessment: Report given to RN and Post -op Vital signs reviewed and stable  Post vital signs: stable  Last Vitals:  Vitals Value Taken Time  BP 87/70 12/27/22 1429  Temp    Pulse    Resp 15 12/27/22 1431  SpO2    Vitals shown include unvalidated device data.  Last Pain:  Vitals:   12/27/22 1239  TempSrc: Temporal  PainSc: 0-No pain         Complications: No notable events documented.

## 2022-12-27 NOTE — Op Note (Signed)
[facility] Patient Name: Jeanne Riley Procedure Date: 12/27/2022 1:46 PM MRN: [id number] Account #: [id number] Date of Birth: [birth year] Admit Type: Outpatient Age: 42 Room: [facility] ENDO ROOM 3 Gender: Female Note Status: Finalized Instrument Name: Park Meo [id number] Procedure:             Colonoscopy Indications:           Change in bowel habits Providers:             Andrey Farmer MD, MD Referring MD:          Juluis Rainier (Referring MD) Medicines:             Monitored Anesthesia Care Complications:         No immediate complications. Procedure:             Pre-Anesthesia Assessment:                        - Prior to the procedure, a History and Physical was                         performed, and patient medications and allergies were                         reviewed. The patient is competent. The risks and                         benefits of the procedure and the sedation options and                         risks were discussed with the patient. All questions                         were answered and informed consent was obtained.                         Patient identification and proposed procedure were                         verified by the physician, the nurse, the                         anesthesiologist, the anesthetist and the technician                         in the endoscopy suite. Mental Status Examination:                         alert and oriented. Airway Examination: normal                         oropharyngeal airway and neck mobility. Respiratory                         Examination: clear to auscultation. CV Examination:                         normal. Prophylactic Antibiotics: The patient does not  require prophylactic antibiotics. Prior                         Anticoagulants: The patient has taken no anticoagulant                         or antiplatelet agents. ASA Grade Assessment: II - A                          patient with mild systemic disease. After reviewing                         the risks and benefits, the patient was deemed in                         satisfactory condition to undergo the procedure. The                         anesthesia plan was to use monitored anesthesia care                         (MAC). Immediately prior to administration of                         medications, the patient was re-assessed for adequacy                         to receive sedatives. The heart rate, respiratory                         rate, oxygen saturations, blood pressure, adequacy of                         pulmonary ventilation, and response to care were                         monitored throughout the procedure. The physical                         status of the patient was re-assessed after the                         procedure.                        After obtaining informed consent, the colonoscope was                         passed under direct vision. Throughout the procedure,                         the patient's blood pressure, pulse, and oxygen                         saturations were monitored continuously. The                         Colonoscope was introduced through the anus and  advanced to the the terminal ileum. The colonoscopy                         was somewhat difficult due to restricted mobility of                         the colon. Successful completion of the procedure was                         aided by withdrawing the scope and replacing with the                         pediatric colonoscope. The patient tolerated the                         procedure well. The quality of the bowel preparation                         was good. The terminal ileum, ileocecal valve,                         appendiceal orifice, and rectum were photographed. Findings:      The perianal and digital rectal examinations were normal.      The terminal ileum  appeared normal.      Normal mucosa was found in the entire colon. Biopsies for histology were       taken with a cold forceps for evaluation of microscopic colitis.      Internal hemorrhoids were found during retroflexion. The hemorrhoids       were Grade I (internal hemorrhoids that do not prolapse).      The exam was otherwise without abnormality on direct and retroflexion       views. Impression:            - The examined portion of the ileum was normal.                        - Normal mucosa in the entire examined colon. Biopsied.                        - Internal hemorrhoids.                        - The examination was otherwise normal on direct and                         retroflexion views. The sigmoid is somewhat difficult,                         likely due to recent hysterectomy. Recommendation:        - Discharge patient to home.                        - Resume previous diet.                        - Continue present medications.                        - Await pathology  results.                        - Repeat colonoscopy in 10 years for screening                         purposes.                        - Return to referring physician as previously                         scheduled. Procedure Code(s):     --- Professional ---                        [phone], Colonoscopy, flexible; with biopsy, single or                         multiple Diagnosis Code(s):     --- Professional ---                        K64.0, First degree hemorrhoids                        R19.4, Change in bowel habit CPT copyright 2022 [employer]. All rights reserved. The codes documented in this report are preliminary and upon coder review may  be revised to meet current compliance requirements. Andrey Farmer MD, MD 12/27/2022 2:27:44 PM Number of Addenda: 0 Note Initiated On: 12/27/2022 1:46 PM Scope Withdrawal Time: 0 hours 8 minutes 55 seconds  Total Procedure Duration: 0 hours 25  minutes 4 seconds  Estimated Blood Loss:  Estimated blood loss: none.      [facility]

## 2022-12-30 ENCOUNTER — Encounter: Payer: Self-pay | Admitting: Gastroenterology

## 2022-12-31 LAB — SURGICAL PATHOLOGY

## 2023-09-08 ENCOUNTER — Other Ambulatory Visit: Payer: Self-pay | Admitting: Nurse Practitioner

## 2023-09-08 DIAGNOSIS — R1310 Dysphagia, unspecified: Secondary | ICD-10-CM

## 2023-09-12 ENCOUNTER — Inpatient Hospital Stay: Admission: RE | Admit: 2023-09-12 | Payer: Medicaid Other | Source: Ambulatory Visit

## 2023-09-15 ENCOUNTER — Ambulatory Visit
Admission: RE | Admit: 2023-09-15 | Discharge: 2023-09-15 | Disposition: A | Discharge status: Home / Self Care | Payer: Medicaid Other | Source: Ambulatory Visit | Attending: Nurse Practitioner | Admitting: Nurse Practitioner

## 2023-09-15 DIAGNOSIS — R1310 Dysphagia, unspecified: Secondary | ICD-10-CM

## 2023-09-24 ENCOUNTER — Other Ambulatory Visit: Payer: Self-pay | Admitting: Obstetrics and Gynecology

## 2023-09-24 DIAGNOSIS — Z1231 Encounter for screening mammogram for malignant neoplasm of breast: Secondary | ICD-10-CM

## 2023-12-16 ENCOUNTER — Other Ambulatory Visit: Payer: Self-pay | Admitting: Surgery

## 2023-12-16 DIAGNOSIS — Z9889 Other specified postprocedural states: Secondary | ICD-10-CM

## 2023-12-16 DIAGNOSIS — M7522 Bicipital tendinitis, left shoulder: Secondary | ICD-10-CM

## 2023-12-25 ENCOUNTER — Encounter: Payer: Self-pay | Admitting: Surgery

## 2024-06-16 ENCOUNTER — Other Ambulatory Visit: Payer: Self-pay | Admitting: Nurse Practitioner

## 2024-06-16 DIAGNOSIS — Z1231 Encounter for screening mammogram for malignant neoplasm of breast: Secondary | ICD-10-CM
